# Patient Record
Sex: Male | Born: 1985 | Race: Black or African American | Hispanic: No | Marital: Single | Smoking: Current every day smoker
Health system: Southern US, Community
[De-identification: ages and names within clinical notes are randomized; demographics above are authoritative.]

---
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
## 2012-06-26 ENCOUNTER — Emergency Department (HOSPITAL_COMMUNITY): Payer: No Typology Code available for payment source

## 2012-06-26 ENCOUNTER — Encounter (HOSPITAL_COMMUNITY): Payer: Self-pay | Admitting: Emergency Medicine

## 2012-06-26 ENCOUNTER — Emergency Department (HOSPITAL_COMMUNITY): Payer: Self-pay

## 2012-06-26 ENCOUNTER — Emergency Department (HOSPITAL_COMMUNITY)
Admission: EM | Admit: 2012-06-26 | Discharge: 2012-06-27 | Disposition: A | Discharge status: Home / Self Care | Payer: No Typology Code available for payment source | Attending: Emergency Medicine | Admitting: Emergency Medicine

## 2012-06-26 DIAGNOSIS — S2220XA Unspecified fracture of sternum, initial encounter for closed fracture: Secondary | ICD-10-CM | POA: Insufficient documentation

## 2012-06-26 DIAGNOSIS — S298XXA Other specified injuries of thorax, initial encounter: Secondary | ICD-10-CM | POA: Insufficient documentation

## 2012-06-26 DIAGNOSIS — S0181XA Laceration without foreign body of other part of head, initial encounter: Secondary | ICD-10-CM

## 2012-06-26 DIAGNOSIS — S0180XA Unspecified open wound of other part of head, initial encounter: Secondary | ICD-10-CM | POA: Insufficient documentation

## 2012-06-26 DIAGNOSIS — Y9389 Activity, other specified: Secondary | ICD-10-CM | POA: Insufficient documentation

## 2012-06-26 DIAGNOSIS — S4980XA Other specified injuries of shoulder and upper arm, unspecified arm, initial encounter: Secondary | ICD-10-CM | POA: Insufficient documentation

## 2012-06-26 DIAGNOSIS — Z23 Encounter for immunization: Secondary | ICD-10-CM | POA: Insufficient documentation

## 2012-06-26 DIAGNOSIS — S46909A Unspecified injury of unspecified muscle, fascia and tendon at shoulder and upper arm level, unspecified arm, initial encounter: Secondary | ICD-10-CM | POA: Insufficient documentation

## 2012-06-26 DIAGNOSIS — Y92410 Unspecified street and highway as the place of occurrence of the external cause: Secondary | ICD-10-CM | POA: Insufficient documentation

## 2012-06-26 LAB — CBC WITH DIFFERENTIAL/PLATELET
Basophils Absolute: 0 10*3/uL (ref 0.0–0.1)
Basophils Relative: 0 % (ref 0–1)
Eosinophils Relative: 2 % (ref 0–5)
HCT: 45.1 % (ref 39.0–52.0)
Lymphocytes Relative: 20 % (ref 12–46)
Lymphs Abs: 1.7 10*3/uL (ref 0.7–4.0)
MCH: 29.1 pg (ref 26.0–34.0)
Monocytes Absolute: 0.7 10*3/uL (ref 0.1–1.0)
Monocytes Relative: 8 % (ref 3–12)
Neutro Abs: 6 10*3/uL (ref 1.7–7.7)
Neutrophils Relative %: 69 % (ref 43–77)
Platelets: 276 10*3/uL (ref 150–400)
RBC: 5.33 MIL/uL (ref 4.22–5.81)
RDW: 13.3 % (ref 11.5–15.5)
WBC: 8.6 10*3/uL (ref 4.0–10.5)

## 2012-06-26 LAB — BASIC METABOLIC PANEL
CO2: 22 mEq/L (ref 19–32)
Calcium: 9.4 mg/dL (ref 8.4–10.5)
Chloride: 102 mEq/L (ref 96–112)
Creatinine, Ser: 0.95 mg/dL (ref 0.50–1.35)
GFR calc non Af Amer: >90 mL/min (ref >90)
Glucose, Bld: 79 mg/dL (ref 70–99)
Potassium: 4.4 mEq/L (ref 3.5–5.1)
Sodium: 137 mEq/L (ref 135–145)

## 2012-06-26 LAB — TROPONIN I: Troponin I: <0.3 ng/mL (ref <0.30)

## 2012-06-26 MED ORDER — OXYCODONE-ACETAMINOPHEN 5-325 MG PO TABS: 1.0000 | ORAL_TABLET | ORAL | Status: DC | PRN

## 2012-06-26 MED ORDER — IOHEXOL 300 MG/ML  SOLN
100.0000 mL | Freq: Once | INTRAMUSCULAR | Status: AC | PRN
  Administered 2012-06-26: 100 mL via INTRAVENOUS

## 2012-06-26 MED ORDER — SODIUM CHLORIDE 0.9 % IV SOLN
INTRAVENOUS | Status: DC
  Administered 2012-06-26: 20:00:00 via INTRAVENOUS

## 2012-06-26 MED ORDER — MORPHINE SULFATE 4 MG/ML IJ SOLN
4.0000 mg | Freq: Once | INTRAMUSCULAR | Status: AC
  Administered 2012-06-26: 4 mg via INTRAVENOUS
  Filled 2012-06-26: qty 1

## 2012-06-26 MED ORDER — TETANUS-DIPHTH-ACELL PERTUSSIS 5-2.5-18.5 LF-MCG/0.5 IM SUSP
0.5000 mL | Freq: Once | INTRAMUSCULAR | Status: AC
  Administered 2012-06-26: 0.5 mL via INTRAMUSCULAR
  Filled 2012-06-26: qty 0.5
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
## 2012-06-26 NOTE — ED Notes (Signed)
TRANSPORTED TO RADIOLOGY.
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
## 2012-06-27 NOTE — Progress Notes (Signed)
Orthopedic Tech Progress Note Patient Details:  Bobby Gomez [birth year] [id number]  Ortho Devices Type of Ortho Device: Arm sling   Haskell Flirt 06/27/2012, 12:03 AM
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
## 2017-09-21 ENCOUNTER — Encounter (HOSPITAL_COMMUNITY): Payer: Self-pay | Admitting: Emergency Medicine

## 2017-09-21 ENCOUNTER — Other Ambulatory Visit: Payer: Self-pay

## 2017-09-21 ENCOUNTER — Ambulatory Visit (HOSPITAL_COMMUNITY)
Admission: EM | Admit: 2017-09-21 | Discharge: 2017-09-21 | Disposition: A | Discharge status: Home / Self Care | Payer: BC Managed Care – PPO | Attending: Family Medicine | Admitting: Family Medicine

## 2017-09-21 DIAGNOSIS — R197 Diarrhea, unspecified: Secondary | ICD-10-CM

## 2017-09-21 DIAGNOSIS — L308 Other specified dermatitis: Secondary | ICD-10-CM | POA: Diagnosis not present

## 2017-09-21 MED ORDER — LOPERAMIDE HCL 2 MG PO CAPS: 2.0000 mg | ORAL_CAPSULE | Freq: Four times a day (QID) | ORAL | 0 refills | Status: AC | PRN

## 2017-09-21 MED ORDER — TRIAMCINOLONE ACETONIDE 0.1 % EX CREA: 1.0000 "application " | TOPICAL_CREAM | Freq: Two times a day (BID) | CUTANEOUS | 2 refills | Status: AC

## 2017-09-21 NOTE — ED Triage Notes (Signed)
Pt reports abdominal pain, nausea, and diarrhea since Wednesday.  Pt states the symptoms are getting better.

## 2017-09-21 NOTE — ED Provider Notes (Signed)
[facility]   [id number] 09/21/17 Arrival Time: 1125   SUBJECTIVE:  Bobby Gomez is a 32 y.o. male who presents to the urgent care with complaint of abdominal pain, nausea, and diarrhea since Wednesday (two days ago).  Pt states the symptoms are getting better. History reviewed. No pertinent past medical history. History reviewed. No pertinent family history. Social History   Socioeconomic History  . Marital status: Single    Spouse name: Not on file  . Number of children: Not on file  . Years of education: Not on file  . Highest education level: Not on file  Social Needs  . Financial resource strain: Not on file  . Food insecurity - worry: Not on file  . Food insecurity - inability: Not on file  . Transportation needs - medical: Not on file  . Transportation needs - non-medical: Not on file  Occupational History  . Not on file  Tobacco Use  . Smoking status: Current Every Day Smoker    Packs/day: 0.25    Types: Cigarettes  . Smokeless tobacco: Never Used  Substance and Sexual Activity  . Alcohol use: No    Frequency: Never  . Drug use: No  . Sexual activity: Not on file  Other Topics Concern  . Not on file  Social History Narrative  . Not on file   No outpatient medications have been marked as taking for the 09/21/17 encounter [facility] Encounter).   Allergies  Allergen Reactions  . Citrus Itching  . Peanut-Containing Drug Products Itching      ROS: As per HPI, remainder of ROS negative.   OBJECTIVE:   Vitals:   09/21/17 1226  BP: 127/71  Pulse: 72  Temp: 98.6 F (37 C)  TempSrc: Oral  SpO2: 98%     General appearance: alert; no distress Eyes: PERRL; EOMI; conjunctiva normal HENT: normocephalic; atraumatic; TMs normal, canal normal, external ears normal without trauma; nasal mucosa normal; oral mucosa normal Neck: supple Lungs: clear to auscultation bilaterally Heart: regular rate and rhythm Abdomen: soft, non-tender; hyperactive  bowel sounds normal; no masses or organomegaly; no guarding or rebound tenderness Back: no CVA tenderness Extremities: no cyanosis or edema; symmetrical with no gross deformities Skin: warm and dry; diffuse hyperpigmented excoriated abdominal area along with arms and legs. Neurologic: normal gait; grossly normal Psychological: alert and cooperative; normal mood and affect      Labs:  Results for orders placed or performed during the hospital encounter of 06/26/12  CBC with Differential  Result Value Ref Range   WBC 8.6 4.0 - 10.5 K/uL   RBC 5.33 4.22 - 5.81 MIL/uL   Hemoglobin 15.5 13.0 - 17.0 g/dL   HCT 40.9 81.1 - 91.4 %   MCV 84.6 78.0 - 100.0 fL   MCH 29.1 26.0 - 34.0 pg   MCHC 34.4 30.0 - 36.0 g/dL   RDW 78.2 95.6 - 21.3 %   Platelets 276 150 - 400 K/uL   Neutrophils Relative % 69 43 - 77 %   Neutro Abs 6.0 1.7 - 7.7 K/uL   Lymphocytes Relative 20 12 - 46 %   Lymphs Abs 1.7 0.7 - 4.0 K/uL   Monocytes Relative 8 3 - 12 %   Monocytes Absolute 0.7 0.1 - 1.0 K/uL   Eosinophils Relative 2 0 - 5 %   Eosinophils Absolute 0.2 0.0 - 0.7 K/uL   Basophils Relative 0 0 - 1 %   Basophils Absolute 0.0 0.0 - 0.1 K/uL  Basic metabolic  panel  Result Value Ref Range   Sodium 137 135 - 145 mEq/L   Potassium 4.4 3.5 - 5.1 mEq/L   Chloride 102 96 - 112 mEq/L   CO2 22 19 - 32 mEq/L   Glucose, Bld 79 70 - 99 mg/dL   BUN 9 6 - 23 mg/dL   Creatinine, Ser 1.610.95 0.50 - 1.35 mg/dL   Calcium 9.4 8.4 - 09.610.5 mg/dL   GFR calc non Af Amer >90 >90 mL/min   GFR calc Af Amer >90 >90 mL/min  Troponin I  Result Value Ref Range   Troponin I <0.30 <0.30 ng/mL    Labs Reviewed - No data to display  No results found.     ASSESSMENT & PLAN:  1. Diarrhea, unspecified type   2. Other eczema     Meds ordered this encounter  Medications  . loperamide (IMODIUM) 2 MG capsule    Sig: Take 1 capsule (2 mg total) by mouth 4 (four) times daily as needed for diarrhea or loose stools.    Dispense:   12 capsule    Refill:  0  . triamcinolone cream (KENALOG) 0.1 %    Sig: Apply 1 application topically 2 (two) times daily.    Dispense:  453 g    Refill:  2    Reviewed expectations re: course of current medical issues. Questions answered. Outlined signs and symptoms indicating need for more acute intervention. Patient verbalized understanding. After Visit Summary given.    Procedures:      Elvina SidleLauenstein, Sharnee Douglass, MD 09/21/17 [phone]
